# Patient Record
Sex: Female | Born: 1995 | Race: White | Hispanic: No | Marital: Single | Smoking: Never smoker
Health system: Southern US, Community
[De-identification: ages and names within clinical notes are randomized; demographics above are authoritative.]

---
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
## 2013-10-31 ENCOUNTER — Other Ambulatory Visit: Payer: Self-pay | Admitting: *Deleted

## 2013-10-31 DIAGNOSIS — T733XXA Exhaustion due to excessive exertion, initial encounter: Secondary | ICD-10-CM

## 2013-10-31 DIAGNOSIS — D500 Iron deficiency anemia secondary to blood loss (chronic): Secondary | ICD-10-CM

## 2013-10-31 DIAGNOSIS — E611 Iron deficiency: Secondary | ICD-10-CM

## 2013-11-03 ENCOUNTER — Other Ambulatory Visit: Payer: BC Managed Care – PPO

## 2013-11-03 DIAGNOSIS — E611 Iron deficiency: Secondary | ICD-10-CM

## 2013-11-03 DIAGNOSIS — T733XXA Exhaustion due to excessive exertion, initial encounter: Secondary | ICD-10-CM

## 2013-11-03 DIAGNOSIS — D500 Iron deficiency anemia secondary to blood loss (chronic): Secondary | ICD-10-CM

## 2013-11-03 LAB — CBC
HCT: 38.6 % (ref 36.0–46.0)
HEMOGLOBIN: 12.9 g/dL (ref 12.0–15.0)
MCH: 25.5 pg — AB (ref 26.0–34.0)
MCHC: 33.4 g/dL (ref 30.0–36.0)
MCV: 76.4 fL — ABNORMAL LOW (ref 78.0–100.0)
PLATELETS: 170 10*3/uL (ref 150–400)
RBC: 5.05 MIL/uL (ref 3.87–5.11)
RDW: 16.3 % — ABNORMAL HIGH (ref 11.5–15.5)
WBC: 6.9 10*3/uL (ref 4.0–10.5)

## 2013-11-03 LAB — FERRITIN: FERRITIN: 2 ng/mL — AB (ref 10–291)

## 2013-11-03 NOTE — Progress Notes (Signed)
CBC AND FERRITIN DONE TODAY Roczen Waymire 

## 2013-11-09 ENCOUNTER — Ambulatory Visit (INDEPENDENT_AMBULATORY_CARE_PROVIDER_SITE_OTHER): Payer: BC Managed Care – PPO | Admitting: Sports Medicine

## 2013-11-09 ENCOUNTER — Encounter: Payer: Self-pay | Admitting: Sports Medicine

## 2013-11-09 ENCOUNTER — Ambulatory Visit: Payer: BC Managed Care – PPO | Admitting: Sports Medicine

## 2013-11-09 VITALS — BP 112/72 | HR 83 | Ht 66.0 in | Wt 118.0 lb

## 2013-11-09 DIAGNOSIS — D500 Iron deficiency anemia secondary to blood loss (chronic): Secondary | ICD-10-CM

## 2013-11-09 DIAGNOSIS — IMO0002 Reserved for concepts with insufficient information to code with codable children: Secondary | ICD-10-CM

## 2013-11-09 DIAGNOSIS — D509 Iron deficiency anemia, unspecified: Secondary | ICD-10-CM

## 2013-11-09 DIAGNOSIS — E611 Iron deficiency: Secondary | ICD-10-CM

## 2013-11-09 DIAGNOSIS — T733XXD Exhaustion due to excessive exertion, subsequent encounter: Secondary | ICD-10-CM

## 2013-11-09 DIAGNOSIS — T733XXA Exhaustion due to excessive exertion, initial encounter: Secondary | ICD-10-CM

## 2013-11-09 NOTE — Patient Instructions (Signed)
Your HGB is good You are at 12.9 Your ferritin is still very low (not enough iron in myoglobin or muscle stores) So let's try building with 3 iron tabs each day No Rush since HGB is good  For your knee you have stronger hamstrings than quads and that causes patello fermoral pain  Add some biking and straight leg raises

## 2013-11-09 NOTE — Progress Notes (Signed)
Patient ID: Sarah RilesSarah M Mercado, female   DOB: 1995/10/10, 18 y.o.   MRN: [id number]  Patient returns with plans to run a 50K race In late high school I had seen her with significant anemia Her hemoglobin steadily improved with iron supplementation I do not think she got to a full therapeutic dose because she had trouble finding an iron tablet that worked well She denies using ferrous gluconate and feels better with this Her hemoglobin has steadily increased from her last year was 13 and 6 days ago was 12.9  Nevertheless her total body iron stores have been very hard to replace Her ferritin 1 year ago was 2 and again it is 2  However, she does not feel tired or anemic and has no other generalized symptoms Her OB/GYN is given her type of pill that has cut down on her periods She sounds like her diet has been relatively good although she doesn't eat a lot of red meat she's been trying to work with the dietitian and get more iron in her diet  Her OB/GYN did offer to give her IV iron if needed  Has had some mild left anterior knee pain  Physical exam  no acute distress BP 112/72  Pulse 83  Ht 5\' 6"  (1.676 m)  Wt 118 lb (53.524 kg)  BMI 19.05 kg/m2  Lungs and  coronary exam unremarkable Excellent strength of hip abduction and flexion Excellent hamstring and calf strength Some weakness of the left quadriceps  LT Knee: Normal to inspection with no erythema or effusion or obvious bony abnormalities. Palpation normal with no warmth or joint line tenderness or patellar tenderness or condyle tenderness. ROM normal in flexion and extension and lower leg rotation. Ligaments with solid consistent endpoints including ACL, PCL, LCL, MCL. Negative Mcmurray's and provocative meniscal tests. Non painful patellar compression. Patellar and quadriceps tendons unremarkable.

## 2013-11-09 NOTE — Assessment & Plan Note (Signed)
Hemoglobin has remained in an acceptable range for the last year

## 2013-11-09 NOTE — Assessment & Plan Note (Signed)
With her low ferritin I would like to build her iron stores  I don't think this is necessary due to the because her hemoglobin levels are good and she is able to run 6-7 miles a day  I suggested increasing the dose of ferrous gluconate since she can take that and we can recheck her hemoglobin in about 2 months as well as a ferritin

## 2013-11-09 NOTE — Assessment & Plan Note (Signed)
She has much less fatigue now and feels that she is able to train almost everyday

## 2014-01-13 ENCOUNTER — Encounter: Payer: Self-pay | Admitting: Family Medicine

## 2014-01-13 ENCOUNTER — Ambulatory Visit (INDEPENDENT_AMBULATORY_CARE_PROVIDER_SITE_OTHER): Payer: BC Managed Care – PPO | Admitting: Family Medicine

## 2014-01-13 VITALS — BP 110/76 | Ht 65.0 in | Wt 118.0 lb

## 2014-01-13 DIAGNOSIS — F509 Eating disorder, unspecified: Secondary | ICD-10-CM

## 2014-01-13 DIAGNOSIS — D500 Iron deficiency anemia secondary to blood loss (chronic): Secondary | ICD-10-CM

## 2014-01-13 DIAGNOSIS — T733XXD Exhaustion due to excessive exertion, subsequent encounter: Secondary | ICD-10-CM

## 2014-01-13 DIAGNOSIS — S86899A Other injury of other muscle(s) and tendon(s) at lower leg level, unspecified leg, initial encounter: Secondary | ICD-10-CM

## 2014-01-13 DIAGNOSIS — IMO0002 Reserved for concepts with insufficient information to code with codable children: Secondary | ICD-10-CM

## 2014-01-13 DIAGNOSIS — T733XXA Exhaustion due to excessive exertion, initial encounter: Secondary | ICD-10-CM

## 2014-01-13 LAB — CBC
HEMATOCRIT: 46.2 % — AB (ref 36.0–46.0)
Hemoglobin: 15.5 g/dL — ABNORMAL HIGH (ref 12.0–15.0)
MCH: 29 pg (ref 26.0–34.0)
MCHC: 33.5 g/dL (ref 30.0–36.0)
MCV: 86.5 fL (ref 78.0–100.0)
PLATELETS: 187 10*3/uL (ref 150–400)
RBC: 5.34 MIL/uL — ABNORMAL HIGH (ref 3.87–5.11)
RDW: 15.9 % — ABNORMAL HIGH (ref 11.5–15.5)
WBC: 8.3 10*3/uL (ref 4.0–10.5)

## 2014-01-13 MED ORDER — MELOXICAM 15 MG PO TABS: 15.0000 mg | ORAL_TABLET | Freq: Every day | ORAL | Status: DC

## 2014-01-13 NOTE — Progress Notes (Signed)
Sarah Mercado - 18 y.o. female MRN [id number]  Date of birth: [birth year]  SUBJECTIVE:  Including CC & ROS.  The patient presents for evaluation of: Bilateral medial shin pain: Patient reports 6 weeks of medial shin pain that has worsened over the past 3 weeks in spite of stopping training for her upcoming 50k Trail race.  Reports having increased her mileage and activity and was able to delay 14 mile run 3 weeks ago but then had almost debilitating bilateral (right greater than left) medial shin pain that is nonfocal. She's not been taking any medications specifically for this and has not been icing a regular basis but she is completely stopped running but is still continuing to do 6-7 hours of activity per week including aqua jogging and exercise bike. She does have additional walking on campus.  She reports being extremely busy at school but denies any significant stress or anxiety. She gets approximately 10 hours of sleep per night.  HISTORY: Past Medical, Surgical, Social, and Family History Reviewed & Updated per EMR. Pertinent Historical Findings include: History of iron deficiency anemia with most recent ferritin level of 2, 2 months ago. She was told by her primary care physician last year when she had dropped to 80 pounds she has anorexia. She was recommended to undergo inpatient treatment for this however she declined. She's not been followed by her nutritionist in over the past month but reports having a good relationship with her and feels as though her food intake is adequate and appropriate at this time.  -She is on oral contraception, her OB/GYN has recommended consideration for IV iron and she reports being compliant with her ferrous gluconate twice a day. - No significant prior running injuries - No significant surgeries  DATA REVIEWED: Most recent ferritin level of 2 with a hemoglobin of 12.9 and an MCV of 76.4 Vitamin D and B12 not been checked  OBJECTIVE FINDINGS:  VS:  HT:5'  5" (165.1 cm)   WT:118 lb (53.524 kg)  BMI:19.7          BP:110/76 mmHg  HR: bpm  TEMP: ( )  RESP:   PHYSICAL EXAM:            GENERAL:  Young adult Caucasian female. In no discomfort; no respiratory distress. Somewhat pale appearing               PSYCH:  alert and appropriate,  moderate insight  Bilateral leg  Exam:   APPEAR/PALP:  Normal-appearing.  Generalized tenderness along the medial tibia without focal findings. Minimal tenderness palpation over the proximal posterior tibialis. No significant tenderness over bilateral peroneal tendons, posterior tibialis tendons or anterior tibialis tendons. She does have subluxation of the right peroneal tendons without pain.                    ROM:  Full knee flexion extension, ankle flexion, dorsiflexion, inversion, eversion        STRENGTH:  Strength is 5+ out of 5 in the above motions.                   NV:  Sensation is grossly intact              Tests:  Generalized pain with bilateral single-leg hop test after 4-6 hops  Limited MSK Ultrasound Exam Of bilateral shins:         Findings:    no focal cortical irregularity of bilateral tibia but there is diffuse  hypervascularity of the bilateral periosteum. No significant fluid collection. No hypoechoic changes within posterior tibialis muscle belly      Impression: The above findings are consistent with bilateral medial tibial stress syndrome.  No evidence of Stress Fx  ASSESSMENT: 1. Medial tibial stress syndrome, unspecified laterality, initial encounter   2. Iron deficiency anemia due to chronic blood loss   3. Fatigue due to excessive exertion, subsequent encounter    Unfortunately this likely reflects a energy expenditure versus energy balance dysfunction. Overall her weight has been maintained given the significant iron deficiency and new symptoms of concern for potential worsening of metabolic dysfunction.   PLAN: See problem based charting & AVS for additional documentation. - Patient  is to avoid running at this time; no evidence of stress fracture but given significant symptoms dicussed this is a concern. Okay to continue with light crosstraining activities. If is having pain greater than 2-3/10 she will discontinue activity. Spent extensive time discussing the implications of continuing activity and importance of recovery/nutrition regarding physical activity. - Meloxicam given inflammatory findings on ultrasound. Ankle plantar flexion, dorsiflexion exercises provided. Encouraged ice massage.   - Continue Iron Supplement.  - Ensure adequate nutritional intake and encouraged follow up with her dietician. - Basic lab work as below Orders Placed This Encounter  Procedures  . CBC  . Comprehensive metabolic panel  . Ferritin  . Vitamin D (25 hydroxy)  . Prealbumin  . Vitamin B12     Return in about 4 weeks (around 02/10/2014) for Will call with results of labs.Marland Kitchen

## 2014-01-14 LAB — COMPREHENSIVE METABOLIC PANEL
ALT: 21 U/L (ref 0–35)
AST: 33 U/L (ref 0–37)
Albumin: 4.2 g/dL (ref 3.5–5.2)
Alkaline Phosphatase: 65 U/L (ref 39–117)
BILIRUBIN TOTAL: 0.3 mg/dL (ref 0.2–1.2)
BUN: 18 mg/dL (ref 6–23)
CALCIUM: 9.3 mg/dL (ref 8.4–10.5)
CHLORIDE: 105 meq/L (ref 96–112)
CO2: 23 meq/L (ref 19–32)
CREATININE: 0.79 mg/dL (ref 0.50–1.10)
Glucose, Bld: 72 mg/dL (ref 70–99)
Potassium: 4 mEq/L (ref 3.5–5.3)
Sodium: 137 mEq/L (ref 135–145)
Total Protein: 6.8 g/dL (ref 6.0–8.3)

## 2014-01-14 LAB — PREALBUMIN: Prealbumin: 33.2 mg/dL (ref 17.0–34.0)

## 2014-01-14 LAB — FERRITIN: FERRITIN: 13 ng/mL (ref 10–291)

## 2014-01-14 LAB — VITAMIN B12: Vitamin B-12: 569 pg/mL (ref 211–911)

## 2014-01-14 LAB — VITAMIN D 25 HYDROXY (VIT D DEFICIENCY, FRACTURES): Vit D, 25-Hydroxy: 51 ng/mL (ref 30–89)

## 2014-01-16 DIAGNOSIS — F509 Eating disorder, unspecified: Secondary | ICD-10-CM | POA: Insufficient documentation

## 2014-01-16 NOTE — Progress Notes (Signed)
Patient ID: SEJLA MARZANO, female   DOB: [birth year], 18 y.o.   MRN: [id number] [facility]: Attending Note: I have reviewed the chart, discussed wit the Sports Medicine Fellow. I agree with assessment and treatment plan as detailed in the Fellow's note. On review of her weights she had an episode of significant weight loss in past. I am concerned there is underlying eating disorder contributing to her current pain issues. I also think she is very ambitious to plan on running 50K given her current training level. We discussed.

## 2014-01-18 ENCOUNTER — Encounter: Payer: Self-pay | Admitting: Family Medicine

## 2014-04-25 ENCOUNTER — Encounter (INDEPENDENT_AMBULATORY_CARE_PROVIDER_SITE_OTHER): Payer: Self-pay

## 2014-04-25 ENCOUNTER — Encounter: Payer: Self-pay | Admitting: Family Medicine

## 2014-04-25 ENCOUNTER — Ambulatory Visit (INDEPENDENT_AMBULATORY_CARE_PROVIDER_SITE_OTHER): Payer: BC Managed Care – PPO | Admitting: Family Medicine

## 2014-04-25 ENCOUNTER — Ambulatory Visit (HOSPITAL_BASED_OUTPATIENT_CLINIC_OR_DEPARTMENT_OTHER)
Admission: RE | Admit: 2014-04-25 | Discharge: 2014-04-25 | Disposition: A | Discharge status: Home / Self Care | Payer: BC Managed Care – PPO | Source: Ambulatory Visit | Attending: Family Medicine | Admitting: Family Medicine

## 2014-04-25 VITALS — BP 109/75 | HR 67 | Ht 66.0 in | Wt 120.0 lb

## 2014-04-25 DIAGNOSIS — M545 Low back pain, unspecified: Secondary | ICD-10-CM

## 2014-04-25 DIAGNOSIS — M25552 Pain in left hip: Secondary | ICD-10-CM | POA: Diagnosis not present

## 2014-05-01 ENCOUNTER — Other Ambulatory Visit: Payer: BC Managed Care – PPO

## 2014-05-06 ENCOUNTER — Ambulatory Visit (HOSPITAL_BASED_OUTPATIENT_CLINIC_OR_DEPARTMENT_OTHER): Admission: RE | Admit: 2014-05-06 | Payer: BC Managed Care – PPO | Source: Ambulatory Visit

## 2014-05-06 ENCOUNTER — Other Ambulatory Visit (HOSPITAL_BASED_OUTPATIENT_CLINIC_OR_DEPARTMENT_OTHER): Payer: BC Managed Care – PPO

## 2014-05-08 DIAGNOSIS — M25552 Pain in left hip: Secondary | ICD-10-CM | POA: Insufficient documentation

## 2014-05-08 NOTE — Progress Notes (Signed)
PCP:  Duane Lope, MD  Subjective:   HPI: Patient is a 19 y.o. female here for low back/hip pain.  Patient reports she runs about 1 hour a day (doesn't keep track of mileage). She reports she first started getting low back into left buttock/hip pain about 40 minutes into a run on 12/13. No pain at rest. Has progressed to the point that she's now having pain with walking. Worse also with running. Tried ibuprofen. No prior history of stress fracture. Has not done anything with repetitive extension.  No past medical history on file.  Current Outpatient Prescriptions on File Prior to Visit  Medication Sig Dispense Refill  . b complex vitamins tablet Take 1 tablet by mouth daily.    . ferrous gluconate (FERGON) 246 (28 FE) MG tablet Take 1 tablet (246 mg total) by mouth daily with breakfast. 90 tablet 12  . LO LOESTRIN FE 1 MG-10 MCG / 10 MCG tablet     . norethindrone-ethinyl estradiol-iron (MICROGESTIN FE,GILDESS FE,LOESTRIN FE) 1.5-30 MG-MCG tablet Take 1 tablet by mouth daily.     No current facility-administered medications on file prior to visit.    No past surgical history on file.  No Known Allergies  History   Social History  . Marital Status: Single    Spouse Name: N/A    Number of Children: N/A  . Years of Education: N/A   Occupational History  . Not on file.   Social History Main Topics  . Smoking status: Never Smoker   . Smokeless tobacco: Not on file  . Alcohol Use: Not on file  . Drug Use: Not on file  . Sexual Activity: Not on file   Other Topics Concern  . Not on file   Social History Narrative    No family history on file.  BP 109/75 mmHg  Pulse 67  Ht  (1.676 m)  Wt 120 lb (54.432 kg)  BMI 19.38 kg/m2  Review of Systems: See HPI above.    Objective:  Physical Exam:  Gen: NAD  Back/left hip: No gross deformity, scoliosis. No TTP of greater trochanter, midline, paraspinal muscles.  FROM without pain on extension or  flexion. Strength LEs 5/5 all muscle groups.   2+ MSRs in patellar and achilles tendons, equal bilaterally. Negative SLRs. Unable to do hop test 2/2 pain. Negative fulcrum. Sensation intact to light touch bilaterally. Negative logroll bilateral hips Negative fabers and piriformis stretches.    Assessment & Plan:  1. Back/left hip pain - Radiographs of lumbar spine and left hip negative.  With her history of progressive pain in a runner (doing about 1 hour a day when this started) and pain starting while running I'm most concerned about a stress fracture.  Would suspect pelvis or sacrum being most likely location based on exam though proximal femur/hip also a possibility.  Will go ahead with MRI to further assess.  No running in meantime.  Encouraged to use crutches without weight bearing while waiting on results.  Addendum:  MRI reviewed and discussed with patient.  She does have a prominent stress fracture left S1 and S1 segments of sacrum.  Advised patient these generally heal very well with conservative treatment.  She can use crutches if needed.  No running.  Cycling and swimming ok if not painful.  Advised she should not try walk/jog program until she is at least 10 weeks out and is pain free doing so (generally take 10-12 weeks to heal from this type of fracture).  She will be out of the country in [place] then [place] for 4 months so we will be unable to see her for follow-up.  Call us with any concerns or questions.

## 2014-05-08 NOTE — Assessment & Plan Note (Signed)
Radiographs of lumbar spine and left hip negative.  With her history of progressive pain in a runner (doing about 1 hour a day when this started) and pain starting while running I'm most concerned about a stress fracture.  Would suspect pelvis or sacrum being most likely location based on exam though proximal femur/hip also a possibility.  Will go ahead with MRI to further assess.  No running in meantime.  Encouraged to use crutches without weight bearing while waiting on results.

## 2014-05-12 ENCOUNTER — Encounter: Payer: Self-pay | Admitting: Family Medicine

## 2015-08-22 ENCOUNTER — Ambulatory Visit (HOSPITAL_BASED_OUTPATIENT_CLINIC_OR_DEPARTMENT_OTHER)
Admission: RE | Admit: 2015-08-22 | Discharge: 2015-08-22 | Disposition: A | Discharge status: Home / Self Care | Payer: BLUE CROSS/BLUE SHIELD | Source: Ambulatory Visit | Attending: Family Medicine | Admitting: Family Medicine

## 2015-08-22 ENCOUNTER — Encounter: Payer: Self-pay | Admitting: Family Medicine

## 2015-08-22 ENCOUNTER — Ambulatory Visit (INDEPENDENT_AMBULATORY_CARE_PROVIDER_SITE_OTHER): Payer: BLUE CROSS/BLUE SHIELD | Admitting: Family Medicine

## 2015-08-22 VITALS — BP 116/78 | HR 84 | Ht 66.0 in | Wt 141.6 lb

## 2015-08-22 DIAGNOSIS — M545 Low back pain, unspecified: Secondary | ICD-10-CM

## 2015-08-22 DIAGNOSIS — M25552 Pain in left hip: Secondary | ICD-10-CM | POA: Diagnosis not present

## 2015-08-22 NOTE — Progress Notes (Addendum)
PCP:  Duane Lope, MD  Subjective:   HPI: Patient is a 20 y.o. female here for low back/hip pain.  04/25/14: Patient reports she runs about 1 hour a day (doesn't keep track of mileage). She reports she first started getting low back into left buttock/hip pain about 40 minutes into a run on 12/13. No pain at rest. Has progressed to the point that she's now having pain with walking. Worse also with running. Tried ibuprofen. No prior history of stress fracture. Has not done anything with repetitive extension.  08/22/15: Patient reports she never completely improved from last visit. Has continued to have posterior low back into L > R hip pain. Pain is achy, gets stabbing pain after getting up from prolonged sitting. No radiation or numbness, tingling. Worse with increased walking. Able to run but hurts with running as well. Pain level up to 5/10 at most, now is 0/10. No bowel/bladder dysfunction. No new injuries.  No past medical history on file.  Current Outpatient Prescriptions on File Prior to Visit  Medication Sig Dispense Refill  . b complex vitamins tablet Take 1 tablet by mouth daily.    . ferrous gluconate (FERGON) 246 (28 FE) MG tablet Take 1 tablet (246 mg total) by mouth daily with breakfast. 90 tablet 12  . LO LOESTRIN FE 1 MG-10 MCG / 10 MCG tablet     . norethindrone-ethinyl estradiol-iron (MICROGESTIN FE,GILDESS FE,LOESTRIN FE) 1.5-30 MG-MCG tablet Take 1 tablet by mouth daily.     No current facility-administered medications on file prior to visit.    No past surgical history on file.  No Known Allergies  Social History   Social History  . Marital Status: Single    Spouse Name: N/A  . Number of Children: N/A  . Years of Education: N/A   Occupational History  . Not on file.   Social History Main Topics  . Smoking status: Never Smoker   . Smokeless tobacco: Not on file  . Alcohol Use: Not on file  . Drug Use: Not on file  . Sexual Activity: Not on  file   Other Topics Concern  . Not on file   Social History Narrative    No family history on file.  BP 116/78 mmHg  Pulse 84  Ht  (1.676 m)  Wt 141 lb 9.6 oz (64.229 kg)  BMI 22.87 kg/m2  LMP 07/25/2015  Review of Systems: See HPI above.    Objective:  Physical Exam:  Gen: NAD  Back/left hip: No gross deformity, scoliosis. No TTP of greater trochanter, midline, paraspinal muscles.  FROM with minimal pain on flexion. Strength LEs 5/5 all muscle groups.   2+ MSRs in patellar and achilles tendons, equal bilaterally. Negative SLRs. Can perform hop test with minimal pain. Negative fulcrum. Sensation intact to light touch bilaterally. Negative logroll bilateral hips Negative fabers and piriformis stretches.    Assessment & Plan:  1. Back/left hip pain - Reports she never recovered from S1 level stress fracture dating back to 04/2014.  Pain in same location.  She is able to complete hop test now and exam is otherwise reassuring.  Independently reviewed radiographs and no abnormalities.  We discussed it would be unusual for this type of stress fracture to not heal completely but she never improved and exam is otherwise normal.  Advised we go ahead with MRI to assess for level of healing (prefer this over CT due to amount of radiation).  Tylenol, nsaids if needed.  Addendum:  MRI reviewed and discussed with patient.  Sacral stress fracture has healed.  No other abnormalities noted on MRI.  I suspect much of her pain is due to deconditioning as she never completely was pain free after the stress fracture.  She was having a good day when she saw us - discussed her coming in to see us when she's having a bad day to repeat exam.  Advised going to physical therapy - she would like to do one visit and then extensive home program.  She may do more PT in future but is in school at [employer] currently.  F/u in 6 weeks.

## 2015-08-22 NOTE — Assessment & Plan Note (Signed)
Reports she never recovered from S1 level stress fracture dating back to 04/2014.  Pain in same location.  She is able to complete hop test now and exam is otherwise reassuring.  Independently reviewed radiographs and no abnormalities.  We discussed it would be unusual for this type of stress fracture to not heal completely but she never improved and exam is otherwise normal.  Advised we go ahead with MRI to assess for level of healing (prefer this over CT due to amount of radiation).  Tylenol, nsaids if needed.

## 2015-08-23 NOTE — Addendum Note (Signed)
Addended by: Kathi SimpersWISE, Nyrah Demos F on: 08/23/2015 02:00 PM   Modules accepted: Orders

## 2015-08-25 ENCOUNTER — Ambulatory Visit (HOSPITAL_BASED_OUTPATIENT_CLINIC_OR_DEPARTMENT_OTHER)
Admission: RE | Admit: 2015-08-25 | Discharge: 2015-08-25 | Disposition: A | Discharge status: Home / Self Care | Payer: BLUE CROSS/BLUE SHIELD | Source: Ambulatory Visit | Attending: Family Medicine | Admitting: Family Medicine

## 2015-08-25 DIAGNOSIS — M545 Low back pain, unspecified: Secondary | ICD-10-CM

## 2015-08-27 NOTE — Addendum Note (Signed)
Addended by: Kathi SimpersWISE, Taiylor Virden F on: 08/27/2015 04:32 PM   Modules accepted: Orders

## 2015-11-09 ENCOUNTER — Ambulatory Visit (INDEPENDENT_AMBULATORY_CARE_PROVIDER_SITE_OTHER): Payer: BLUE CROSS/BLUE SHIELD | Admitting: Internal Medicine

## 2015-11-09 DIAGNOSIS — IMO0002 Reserved for concepts with insufficient information to code with codable children: Secondary | ICD-10-CM

## 2015-11-09 DIAGNOSIS — Z23 Encounter for immunization: Secondary | ICD-10-CM | POA: Diagnosis not present

## 2015-11-09 DIAGNOSIS — Z7189 Other specified counseling: Secondary | ICD-10-CM

## 2015-11-09 DIAGNOSIS — Z9189 Other specified personal risk factors, not elsewhere classified: Secondary | ICD-10-CM

## 2015-11-09 MED ORDER — AZITHROMYCIN 500 MG PO TABS: 500.0000 mg | ORAL_TABLET | Freq: Every day | ORAL | Status: DC

## 2015-11-09 MED ORDER — AZITHROMYCIN 500 MG PO TABS: 500.0000 mg | ORAL_TABLET | Freq: Every day | ORAL | Status: AC

## 2015-11-09 NOTE — Progress Notes (Signed)
  RFV: pre travel counseling to [place] Subjective:    Patient ID: Orvil FeilSarah Banta, female    DOB: April 29, 1996, 20 y.o.   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 She has previously traveled to [place] and [place], in addition to [place], [place]. She is uptodate on her childhood vaccines as well as typhoid, tdap, and yellow fever. When she went on her trip to [place], she did not suffer any traveler's diarrhea.  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  She suspects that [place]. Interested in [place]ese encephalitis and rabies vaccine  She leaves august 24th   Review of Systems     Objective:   Physical Exam        Assessment & Plan:  - reviewed info and it is ok to do co-administration of rabies and JE without loss of immunogenecity  Pre travel vaccinations - will give japanese encephalitis today and in 28 days. Also will give rabies today, day 7 and day 21.  Traveler's diarrhea = will give rx for azithromycin to use as needed  Malaria prophylaxis = gave malaria map and recommendation to take doxy if she is to be going to location with malaria. [place] is fine for the first 4 weeks of travel.

## 2015-11-16 ENCOUNTER — Ambulatory Visit (INDEPENDENT_AMBULATORY_CARE_PROVIDER_SITE_OTHER): Payer: BLUE CROSS/BLUE SHIELD | Admitting: *Deleted

## 2015-11-16 DIAGNOSIS — Z7189 Other specified counseling: Secondary | ICD-10-CM | POA: Diagnosis not present

## 2015-11-16 DIAGNOSIS — IMO0002 Reserved for concepts with insufficient information to code with codable children: Secondary | ICD-10-CM

## 2015-11-16 DIAGNOSIS — Z9189 Other specified personal risk factors, not elsewhere classified: Secondary | ICD-10-CM | POA: Diagnosis not present

## 2015-11-16 DIAGNOSIS — Z789 Other specified health status: Secondary | ICD-10-CM | POA: Diagnosis not present

## 2015-11-16 DIAGNOSIS — Z23 Encounter for immunization: Secondary | ICD-10-CM

## 2015-11-30 ENCOUNTER — Ambulatory Visit (INDEPENDENT_AMBULATORY_CARE_PROVIDER_SITE_OTHER): Payer: BLUE CROSS/BLUE SHIELD

## 2015-11-30 DIAGNOSIS — Z789 Other specified health status: Secondary | ICD-10-CM

## 2015-11-30 DIAGNOSIS — Z23 Encounter for immunization: Secondary | ICD-10-CM

## 2015-11-30 DIAGNOSIS — IMO0002 Reserved for concepts with insufficient information to code with codable children: Secondary | ICD-10-CM

## 2015-11-30 DIAGNOSIS — Z9189 Other specified personal risk factors, not elsewhere classified: Secondary | ICD-10-CM

## 2015-12-07 ENCOUNTER — Ambulatory Visit (INDEPENDENT_AMBULATORY_CARE_PROVIDER_SITE_OTHER): Payer: BLUE CROSS/BLUE SHIELD

## 2015-12-07 DIAGNOSIS — Z7189 Other specified counseling: Secondary | ICD-10-CM

## 2015-12-07 DIAGNOSIS — Z9189 Other specified personal risk factors, not elsewhere classified: Secondary | ICD-10-CM | POA: Diagnosis not present

## 2015-12-07 DIAGNOSIS — IMO0002 Reserved for concepts with insufficient information to code with codable children: Secondary | ICD-10-CM

## 2015-12-07 DIAGNOSIS — Z23 Encounter for immunization: Secondary | ICD-10-CM

## 2015-12-07 DIAGNOSIS — Z789 Other specified health status: Secondary | ICD-10-CM | POA: Diagnosis not present

## 2016-07-30 ENCOUNTER — Telehealth: Payer: Self-pay | Admitting: *Deleted

## 2016-07-30 NOTE — Telephone Encounter (Signed)
Patient's mother called stating her daughter is in [place] and was on a tour in the jungle, in a cave and her hair was brushed by a bat. Has not noticed any scratches and was not bitten. She received the rabies vaccines 11/2015 and wants to know if she would need a booster. Please advise. She is at work [phone] and ask them to page her.

## 2016-07-30 NOTE — Telephone Encounter (Signed)
Per Dr Drue SecondSnider, patient is well protected by her prophylactic rabies vaccination.  If the Maralyn SagoSarah wanted to be cautious, Dr Drue SecondSnider would recommend 1 ml IM injection of rabies on day 7 and day 14 after encounter with the bat. RN will research the patient's area in [place] to see if there is a clinic we could direct her to.   RN attempted to return the patient's mother's call, but there was no answer.  RN called the patient's listed numbers, but there was no answer. RN called the patient's emergency contact, her father Vickki Hearinglfred Pigman.  RN identified herself and the reason for the call, let him know of Dr Feliz BeamSnider's recommendations.  Patient's father gave me a cell number to contact the patient's mother and relay the information, as she is leaving 3/29 to travel to see Maralyn SagoSarah in [place]. RN called back, connected with patient's mother, her questions were answered to her satisfaction. Andree CossHowell, Loghan Kurtzman M, RN

## 2016-07-31 NOTE — Telephone Encounter (Signed)
Spoke with mom for advice.

## 2016-09-16 DIAGNOSIS — Z01419 Encounter for gynecological examination (general) (routine) without abnormal findings: Secondary | ICD-10-CM | POA: Diagnosis not present

## 2016-09-16 DIAGNOSIS — Z6820 Body mass index (BMI) 20.0-20.9, adult: Secondary | ICD-10-CM | POA: Diagnosis not present

## 2016-09-16 DIAGNOSIS — Z23 Encounter for immunization: Secondary | ICD-10-CM | POA: Diagnosis not present

## 2016-09-16 DIAGNOSIS — Z113 Encounter for screening for infections with a predominantly sexual mode of transmission: Secondary | ICD-10-CM | POA: Diagnosis not present

## 2016-09-16 DIAGNOSIS — Z1159 Encounter for screening for other viral diseases: Secondary | ICD-10-CM | POA: Diagnosis not present

## 2016-09-16 DIAGNOSIS — Z114 Encounter for screening for human immunodeficiency virus [HIV]: Secondary | ICD-10-CM | POA: Diagnosis not present

## 2016-12-20 IMAGING — MR MR PELVIS W/O CM
4 of 6 series · 16 of 48 positions shown · non-contrast
Comparison: Single view of the pelvis 08/22/2015. MRI pelvis
05/04/2014.

CLINICAL DATA: Left hip pain for 1.5 years. History of prior sacral
stress fracture. The patient is a runner. Subsequent encounter.

EXAM:
MRI PELVIS WITHOUT CONTRAST
TECHNIQUE: Multiplanar multisequence MR imaging of the pelvis was performed. No
intravenous contrast was administered.

[Series 2: T1 · axial · 6.0mm · 0.46mm/px · z∈[-213,+18]mm · 7 of 30 slices shown]
[im 1/30]
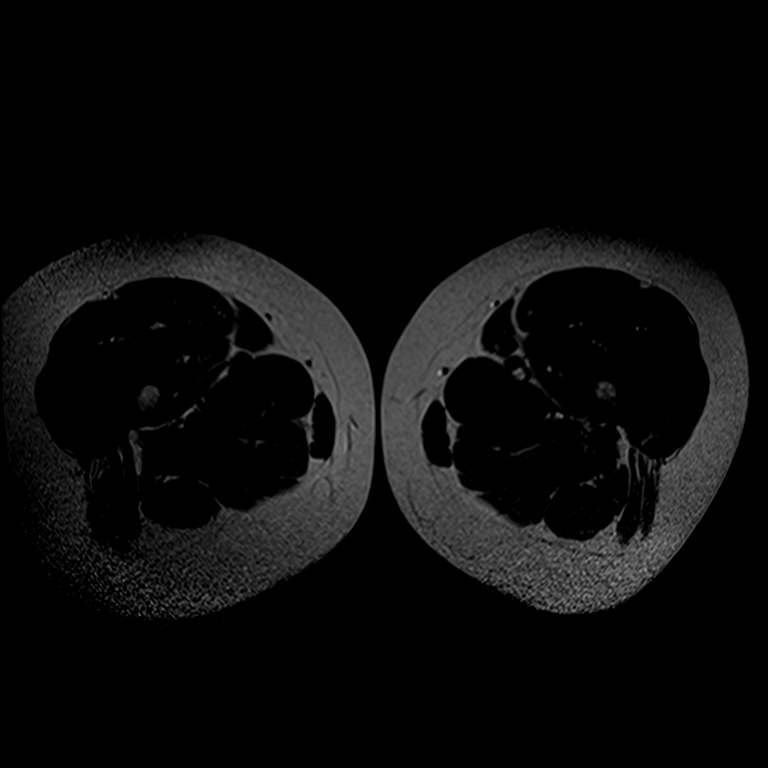
[im 5/30]
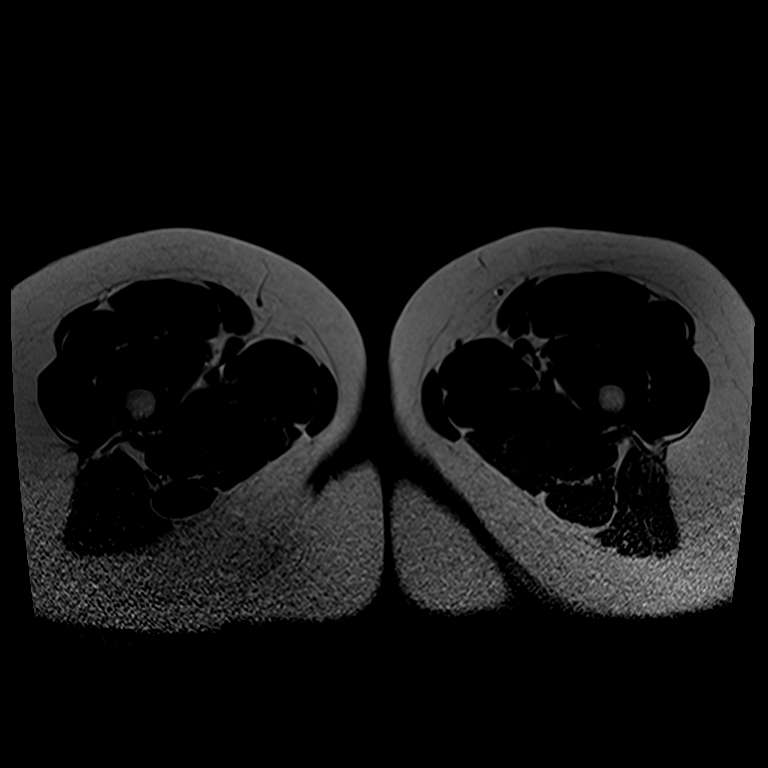
[im 10/30]
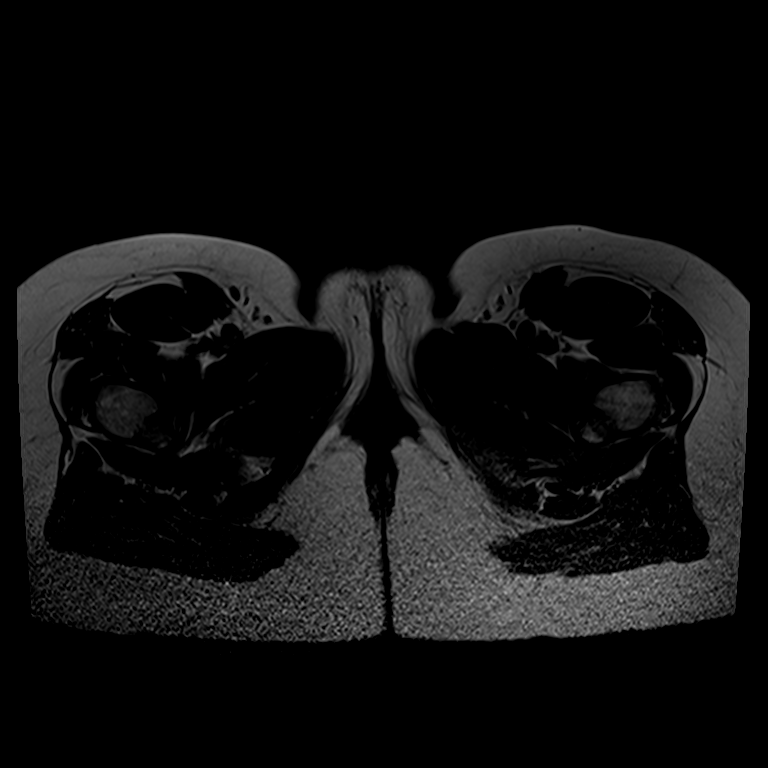
[im 15/30]
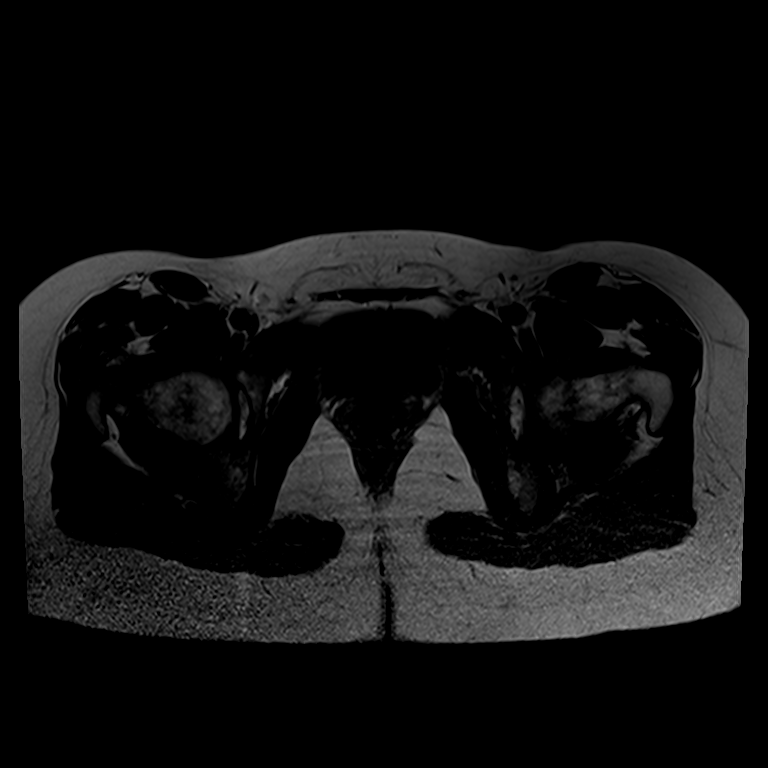
[im 20/30]
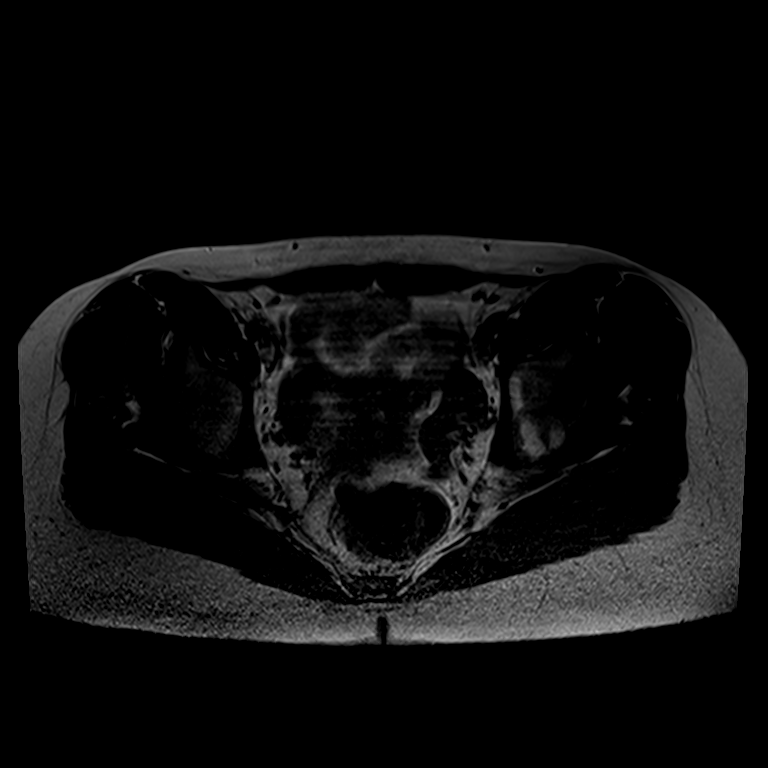
[im 25/30]
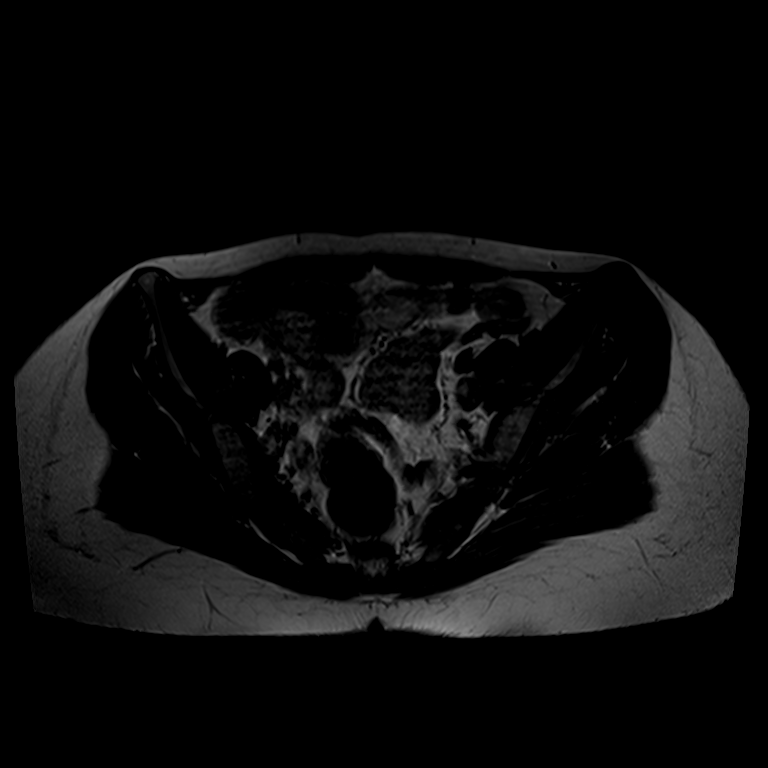
[im 30/30]
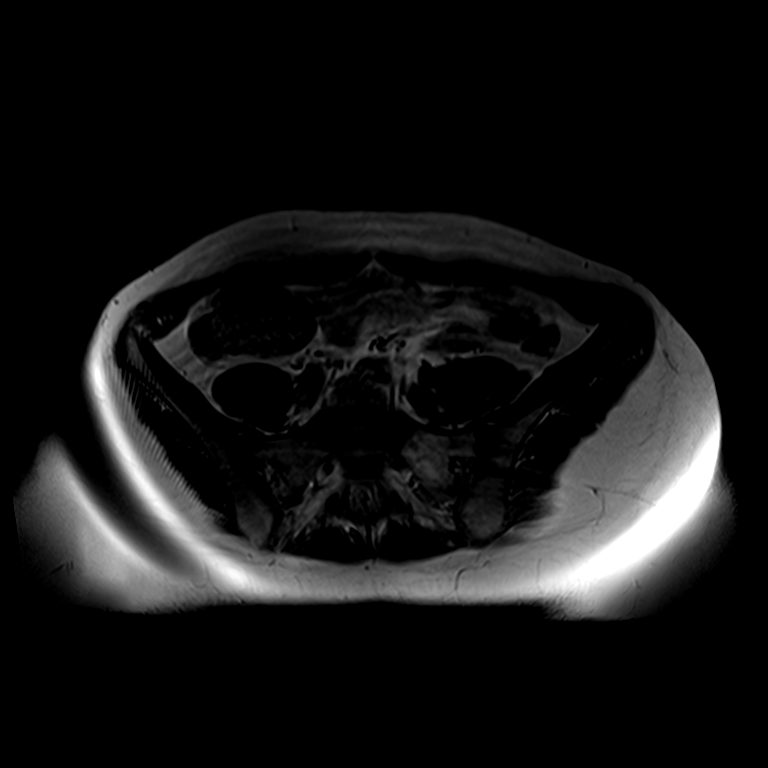

[Series 3: T2 fat-sat · axial · 6.0mm · 1.09mm/px · z∈[-189,+2]mm · 3 of 32 slices shown (1 of 2)]
[im 4/32]
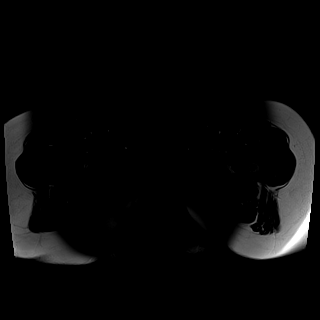
[im 16/32]
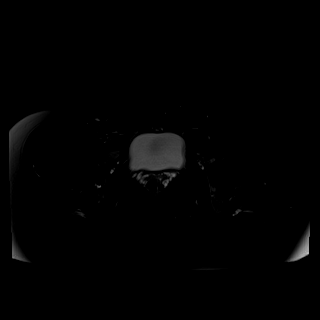
[im 28/32]
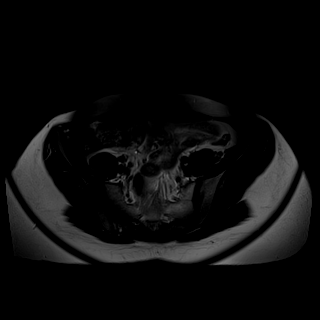

[Series 7: PD fat-sat · sagittal · 4.0mm · 0.36mm/px · 3 of 28 slices shown]
[im 5/28]
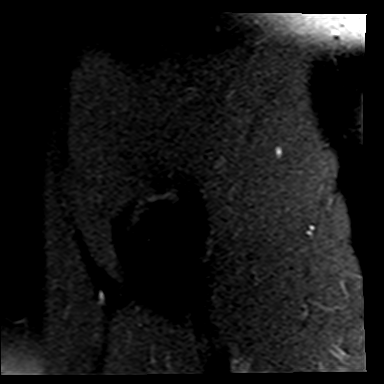
[im 14/28]
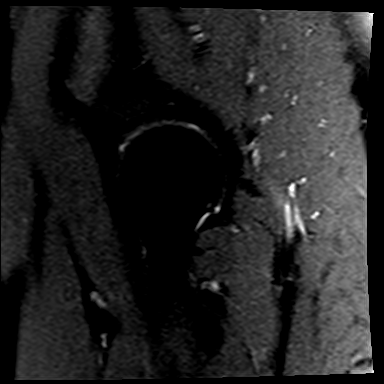
[im 23/28]
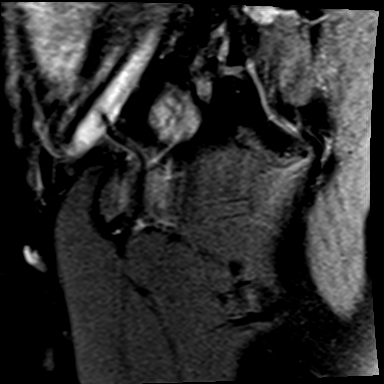

[Series 8: T2 fat-sat · sagittal · 6.0mm · 0.78mm/px · 3 of 40 slices shown (2 of 2)]
[im 4/40]
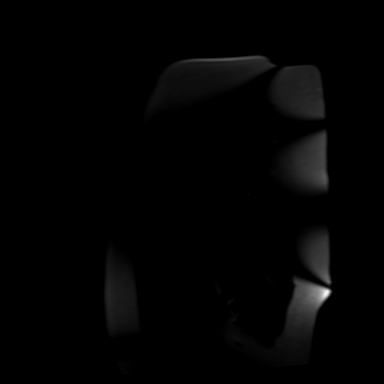
[im 20/40]
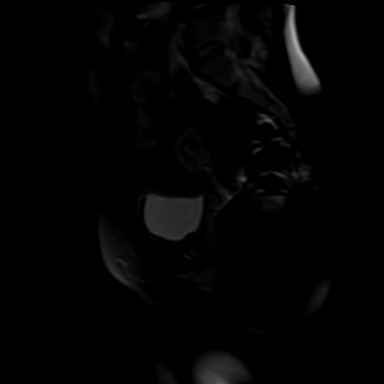
[im 36/40]
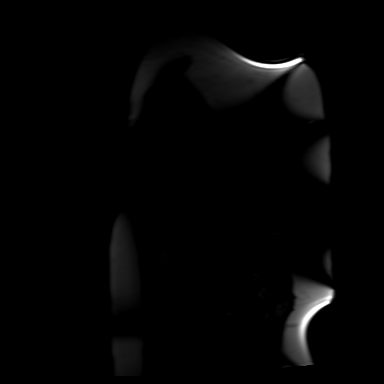

[16 of 48 positions shown; findings below may reference images not displayed]

FINDINGS: Left sacral fracture seen on the prior MRI has healed in anatomic
position and alignment. Bone marrow signal is normal throughout
without fracture or stress change. No worrisome marrow lesion is
identified. There is no avascular necrosis of the femoral heads. No
hip joint effusion is identified and there is no evidence of
bursitis. All imaged muscles and tendons are normal in appearance.
No fluid collection or mass is seen. Imaged intrapelvic contents
appear normal.
IMPRESSION: Negative MRI the pelvis. Left sacral fracture seen on prior MRI has
healed in anatomic position and alignment.

## 2017-02-18 DIAGNOSIS — Z23 Encounter for immunization: Secondary | ICD-10-CM | POA: Diagnosis not present

## 2017-06-20 DIAGNOSIS — J111 Influenza due to unidentified influenza virus with other respiratory manifestations: Secondary | ICD-10-CM | POA: Diagnosis not present

## 2017-06-20 DIAGNOSIS — Z6821 Body mass index (BMI) 21.0-21.9, adult: Secondary | ICD-10-CM | POA: Diagnosis not present

## 2017-06-20 DIAGNOSIS — R509 Fever, unspecified: Secondary | ICD-10-CM | POA: Diagnosis not present

## 2017-06-20 DIAGNOSIS — M791 Myalgia, unspecified site: Secondary | ICD-10-CM | POA: Diagnosis not present

## 2017-06-24 DIAGNOSIS — R300 Dysuria: Secondary | ICD-10-CM | POA: Diagnosis not present

## 2017-06-24 DIAGNOSIS — N766 Ulceration of vulva: Secondary | ICD-10-CM | POA: Diagnosis not present

## 2017-06-24 DIAGNOSIS — L292 Pruritus vulvae: Secondary | ICD-10-CM | POA: Diagnosis not present

## 2017-06-24 DIAGNOSIS — Z6822 Body mass index (BMI) 22.0-22.9, adult: Secondary | ICD-10-CM | POA: Diagnosis not present

## 2017-07-06 DIAGNOSIS — L292 Pruritus vulvae: Secondary | ICD-10-CM | POA: Diagnosis not present

## 2017-07-06 DIAGNOSIS — A6004 Herpesviral vulvovaginitis: Secondary | ICD-10-CM | POA: Diagnosis not present

## 2017-07-06 DIAGNOSIS — Z6822 Body mass index (BMI) 22.0-22.9, adult: Secondary | ICD-10-CM | POA: Diagnosis not present

## 2017-07-06 DIAGNOSIS — N898 Other specified noninflammatory disorders of vagina: Secondary | ICD-10-CM | POA: Diagnosis not present

## 2017-07-24 DIAGNOSIS — Z7189 Other specified counseling: Secondary | ICD-10-CM | POA: Diagnosis not present

## 2017-07-24 DIAGNOSIS — Z029 Encounter for administrative examinations, unspecified: Secondary | ICD-10-CM | POA: Diagnosis not present

## 2017-07-24 DIAGNOSIS — Z6822 Body mass index (BMI) 22.0-22.9, adult: Secondary | ICD-10-CM | POA: Diagnosis not present

## 2017-10-03 DIAGNOSIS — Z23 Encounter for immunization: Secondary | ICD-10-CM | POA: Diagnosis not present

## 2017-10-03 DIAGNOSIS — Z6821 Body mass index (BMI) 21.0-21.9, adult: Secondary | ICD-10-CM | POA: Diagnosis not present

## 2017-10-03 DIAGNOSIS — S91301A Unspecified open wound, right foot, initial encounter: Secondary | ICD-10-CM | POA: Diagnosis not present

## 2017-10-13 DIAGNOSIS — S91301D Unspecified open wound, right foot, subsequent encounter: Secondary | ICD-10-CM | POA: Diagnosis not present

## 2017-12-18 DIAGNOSIS — Z6821 Body mass index (BMI) 21.0-21.9, adult: Secondary | ICD-10-CM | POA: Diagnosis not present

## 2017-12-18 DIAGNOSIS — Z01419 Encounter for gynecological examination (general) (routine) without abnormal findings: Secondary | ICD-10-CM | POA: Diagnosis not present

## 2017-12-18 DIAGNOSIS — Z118 Encounter for screening for other infectious and parasitic diseases: Secondary | ICD-10-CM | POA: Diagnosis not present

## 2019-04-10 DIAGNOSIS — Z20828 Contact with and (suspected) exposure to other viral communicable diseases: Secondary | ICD-10-CM | POA: Diagnosis not present
